# Patient Record
Sex: Male | Born: 1990 | Race: White | Hispanic: No | Marital: Single
Health system: Midwestern US, Community
[De-identification: ages and names within clinical notes are randomized; demographics above are authoritative.]

## PROBLEM LIST (undated history)

## (undated) DIAGNOSIS — S86011D Strain of right Achilles tendon, subsequent encounter: Secondary | ICD-10-CM

---

## 2019-03-14 ENCOUNTER — Ambulatory Visit: Admit: 2019-03-14 | Discharge: 2019-03-14 | Payer: PRIVATE HEALTH INSURANCE | Attending: Foot and Ankle Surgery

## 2019-03-14 DIAGNOSIS — S86011A Strain of right Achilles tendon, initial encounter: Secondary | ICD-10-CM

## 2019-03-14 NOTE — Telephone Encounter (Signed)
Procedure: Right Achilles tendon repair (10626)  ??  CPT codes: see above   ??  Diagnosis: Rupture of right Achilles tendon, initial encounter [S86.011A]  ??  Location for Surgery: [facility]  ??  Schedule for: 1 hour  ??  Admission Type: Outpatient  ??  Medical Clearance: No  ??  Antibiotic: Ancef 2g IV  ??  Anesthesia: General  ??  Position and Table type: Prone on Wilson Frame on Regular OR Table  ??  Radiology: None  ??  Implants: Arthrex PARS  ??  Equipment:    ??

## 2019-03-15 NOTE — Telephone Encounter (Signed)
[employer] phone call, no auth required ref# [id number]

## 2019-03-16 MED ORDER — INDOCYANINE GREEN 25 MG IV SOLR
25 MG | INTRAVENOUS | Status: AC
Start: 2019-03-16 — End: 2019-03-17

## 2019-03-16 MED FILL — INDOCYANINE GREEN 25 MG IV SOLR: 25 mg | INTRAVENOUS | Qty: 10

## 2019-03-17 ENCOUNTER — Inpatient Hospital Stay: Payer: PRIVATE HEALTH INSURANCE

## 2019-03-17 MED ORDER — LIDOCAINE HCL (PF) 2 % IJ SOLN
2 | INTRAMUSCULAR | Status: AC
Start: 2019-03-17 — End: 2019-03-17

## 2019-03-17 MED ORDER — OXYCODONE-ACETAMINOPHEN 5-325 MG PO TABS
5-325 MG | ORAL_TABLET | Freq: Four times a day (QID) | ORAL | 0 refills | Status: AC | PRN
Start: 2019-03-17 — End: 2019-03-24

## 2019-03-17 MED ORDER — ONDANSETRON HCL 4 MG/2ML IJ SOLN
4 | INTRAMUSCULAR | Status: AC
Start: 2019-03-17 — End: 2019-03-17

## 2019-03-17 MED ORDER — LIDOCAINE HCL (PF) 1 % IJ SOLN
1 % | Freq: Once | INTRAMUSCULAR | Status: DC | PRN
Start: 2019-03-17 — End: 2019-03-17

## 2019-03-17 MED ORDER — MIDAZOLAM HCL 2 MG/2ML IJ SOLN
2 | INTRAMUSCULAR | Status: AC
Start: 2019-03-17 — End: 2019-03-17

## 2019-03-17 MED ORDER — ONDANSETRON HCL 4 MG PO TABS
4 MG | ORAL_TABLET | Freq: Every day | ORAL | 0 refills | Status: AC | PRN
Start: 2019-03-17 — End: ?

## 2019-03-17 MED ORDER — LORAZEPAM 2 MG/ML IJ SOLN
2 MG/ML | INTRAMUSCULAR | Status: DC | PRN
Start: 2019-03-17 — End: 2019-03-17

## 2019-03-17 MED ORDER — ESMOLOL HCL 100 MG/10ML IV SOLN
100 | INTRAVENOUS | Status: AC
Start: 2019-03-17 — End: 2019-03-17

## 2019-03-17 MED ORDER — KETAMINE HCL 50 MG/5ML IJ SOSY
50 | INTRAMUSCULAR | Status: AC
Start: 2019-03-17 — End: 2019-03-17

## 2019-03-17 MED ORDER — LACTATED RINGERS IV SOLN
INTRAVENOUS | Status: DC
Start: 2019-03-17 — End: 2019-03-17
  Administered 2019-03-17: 14:00:00 via INTRAVENOUS

## 2019-03-17 MED ORDER — HYDRALAZINE HCL 20 MG/ML IJ SOLN
20 MG/ML | INTRAMUSCULAR | Status: DC | PRN
Start: 2019-03-17 — End: 2019-03-17

## 2019-03-17 MED ORDER — ONDANSETRON HCL 4 MG/2ML IJ SOLN
4 MG/2ML | Freq: Once | INTRAMUSCULAR | Status: DC | PRN
Start: 2019-03-17 — End: 2019-03-17

## 2019-03-17 MED ORDER — ROCURONIUM BROMIDE 50 MG/5ML IV SOLN
50 | INTRAVENOUS | Status: AC
Start: 2019-03-17 — End: 2019-03-17

## 2019-03-17 MED ORDER — DIPHENHYDRAMINE HCL 50 MG/ML IJ SOLN
50 MG/ML | Freq: Once | INTRAMUSCULAR | Status: DC | PRN
Start: 2019-03-17 — End: 2019-03-17

## 2019-03-17 MED ORDER — GABAPENTIN 300 MG PO CAPS
300 MG | Freq: Once | ORAL | Status: AC
Start: 2019-03-17 — End: 2019-03-17
  Administered 2019-03-17: 14:00:00 300 mg via ORAL

## 2019-03-17 MED ORDER — PROPOFOL 200 MG/20ML IV EMUL
200 | INTRAVENOUS | Status: AC
Start: 2019-03-17 — End: 2019-03-17

## 2019-03-17 MED ORDER — BUPIVACAINE-EPINEPHRINE-DEXAMETHASONE (TAP) SYRINGE
Status: AC
Start: 2019-03-17 — End: 2019-03-17

## 2019-03-17 MED ORDER — NORMAL SALINE FLUSH 0.9 % IV SOLN
0.9 % | INTRAVENOUS | Status: DC | PRN
Start: 2019-03-17 — End: 2019-03-17

## 2019-03-17 MED ORDER — PROMETHAZINE HCL 25 MG/ML IJ SOLN
25 MG/ML | Freq: Once | INTRAMUSCULAR | Status: DC | PRN
Start: 2019-03-17 — End: 2019-03-17

## 2019-03-17 MED ORDER — ROPIVACAINE ELASTOMERIC INFUSION 0.2%
Status: DC
Start: 2019-03-17 — End: 2019-03-17
  Administered 2019-03-17: 18:00:00 545 mL

## 2019-03-17 MED ORDER — MIDAZOLAM HCL 2 MG/2ML IJ SOLN
2 MG/ML | INTRAMUSCULAR | Status: DC | PRN
Start: 2019-03-17 — End: 2019-03-17
  Administered 2019-03-17: 14:00:00 2 mg via INTRAVENOUS

## 2019-03-17 MED ORDER — FAMOTIDINE 20 MG PO TABS
20 MG | Freq: Once | ORAL | Status: AC
Start: 2019-03-17 — End: 2019-03-17
  Administered 2019-03-17: 14:00:00 20 mg via ORAL

## 2019-03-17 MED ORDER — ALPRAZOLAM 0.25 MG PO TBDP
0.25 MG | ORAL | Status: DC | PRN
Start: 2019-03-17 — End: 2019-03-17

## 2019-03-17 MED ORDER — NORMAL SALINE FLUSH 0.9 % IV SOLN
0.9 % | Freq: Two times a day (BID) | INTRAVENOUS | Status: DC
Start: 2019-03-17 — End: 2019-03-17

## 2019-03-17 MED ORDER — LABETALOL HCL 5 MG/ML IV SOLN
5 MG/ML | INTRAVENOUS | Status: DC | PRN
Start: 2019-03-17 — End: 2019-03-17

## 2019-03-17 MED ORDER — ASPIRIN EC 81 MG PO TBEC
81 MG | ORAL_TABLET | Freq: Every day | ORAL | 0 refills | Status: AC
Start: 2019-03-17 — End: ?

## 2019-03-17 MED ORDER — ACETAMINOPHEN 500 MG PO TABS
500 MG | Freq: Once | ORAL | Status: AC
Start: 2019-03-17 — End: 2019-03-17
  Administered 2019-03-17: 14:00:00 1000 mg via ORAL

## 2019-03-17 MED ORDER — DEXAMETHASONE SODIUM PHOSPHATE 10 MG/ML IJ SOLN
10 | INTRAMUSCULAR | Status: AC
Start: 2019-03-17 — End: 2019-03-17

## 2019-03-17 MED ORDER — CEFAZOLIN SODIUM-DEXTROSE 2-4 GM/100ML-% IV SOLN
2-4 GM/100ML-% | INTRAVENOUS | Status: AC
Start: 2019-03-17 — End: 2019-03-17
  Administered 2019-03-17: 15:00:00 2000 mg via INTRAVENOUS

## 2019-03-17 MED FILL — ROPIVACAINE ELASTOMERIC INFUSION 0.2%: Qty: 550

## 2019-03-17 MED FILL — CEFAZOLIN SODIUM-DEXTROSE 2-4 GM/100ML-% IV SOLN: 2-4 GM/100ML-% | INTRAVENOUS | Qty: 100

## 2019-03-17 MED FILL — LIDOCAINE HCL (PF) 2 % IJ SOLN: 2 % | INTRAMUSCULAR | Qty: 5

## 2019-03-17 MED FILL — DIPRIVAN 200 MG/20ML IV EMUL: 200 MG/20ML | INTRAVENOUS | Qty: 20

## 2019-03-17 MED FILL — KETAMINE HCL 50 MG/5ML IJ SOSY: 50 MG/5ML | INTRAMUSCULAR | Qty: 5

## 2019-03-17 MED FILL — GABAPENTIN 300 MG PO CAPS: 300 mg | ORAL | Qty: 1

## 2019-03-17 MED FILL — BUPIVACAINE-EPINEPHRINE-DEXAMETHASONE (TAP) SYRINGE: Qty: 90

## 2019-03-17 MED FILL — MIDAZOLAM HCL 2 MG/2ML IJ SOLN: 2 mg/mL | INTRAMUSCULAR | Qty: 2

## 2019-03-17 MED FILL — ROCURONIUM BROMIDE 50 MG/5ML IV SOLN: 50 MG/5ML | INTRAVENOUS | Qty: 5

## 2019-03-17 MED FILL — ONDANSETRON HCL 4 MG/2ML IJ SOLN: 4 MG/2ML | INTRAMUSCULAR | Qty: 2

## 2019-03-17 MED FILL — FAMOTIDINE 20 MG PO TABS: 20 mg | ORAL | Qty: 1

## 2019-03-17 MED FILL — DEXAMETHASONE SODIUM PHOSPHATE 10 MG/ML IJ SOLN: 10 mg/mL | INTRAMUSCULAR | Qty: 1

## 2019-03-17 MED FILL — ESMOLOL HCL 100 MG/10ML IV SOLN: 100 MG/10ML | INTRAVENOUS | Qty: 10

## 2019-03-17 MED FILL — MAPAP 500 MG PO TABS: 500 mg | ORAL | Qty: 2

## 2019-03-17 NOTE — Discharge Instructions (Signed)
SPLINT INSTRUCTIONS  Keep your splint clean and dry.  Do not put anything down in the splint to scratch.  If your splint gets wet or starts to feel uncomfortable call Dr Karolee Meloni immediately ([phone]).    If you see any blood on the outside bandage reinforce it on the surface with gauze and an ace bandage and call Dr Larrie Fraizer [phone].    Do not walk or stand on your splint.  You can rest the splint on the floor but do not put weight on it.  You should get up and move around once or twice an hour based on your comfort level.  You are not to be at bed rest meaning you need to get up and move around in order to prevent blood clots.    It is OK to wiggle your toes and you can contract your calf muscle inside the splint as well.    When you put your foot down it is normal for the toes to turn a bluish or purple color.  Once you elevate the foot for a few minutes a normal pink color should return underneath the toenails.  If the color does not change within 10 minutes after elevating call Dr. Jibril Mcminn.    ELEVATION  Keep your foot/ankle elevated for comfort.  Elevating your foot/ankle is the best way to control your swelling and pain.  Elevation means keeping the toes at the level of your nose.  If your leg is not elevated to that height then it is not truly elevated.    Correct elevation height         Incorrect elevation height    MEDICATIONS  You have been given a prescription for a narcotic medication that is intended for postoperative pain control.  This medication should be used judiciously to try and minimize your discomfort after surgery.  The medication will not relieve all of your pain and you should not take large amounts of the medication in an attempt to be pain free.  Please understand that narcotic medications can be addictive and they are intended to decrease but not eliminate your pain and should therefore be used in moderation.  If you have questions regarding taking the narcotic medication and other  medications that you are currently on please call the office.  You cannot drive or operate machinery while taking the narcotic medication.  You should not drink alcohol or use recreational drugs while taking the narcotic medication.The state of [place] restricts the amount of pain pills that can be legally prescribed and no more than one prescription in a week can be given.  If you require a refill of the pain medication it requires that someone come to the office in person as it cannot be called in to the pharmacy or E-prescribed.  Please understand that there is a limit to the total amount of pain medication that can be prescribed so make every effort to take it only when needed.  Unless otherwise instructed by Dr Lorene Klimas you can take an over the counter anti-inflammatory to help with your pain as well.  This includes but is not limited to Ibuprofen, Advil, Motrin, Alleve, etc.    WEIGHTBEARING INSTRUCTIONS  Dr Jachai Okazaki wants you to be  nonweight bearing on the Right lower extremity. You will continue this weight bearing restriction until your 2 week followup and then your ability to put weight on the operative extremity will be updated.  If you have any problems maintaining this weight bearing status   please call the office so that appropriate instructions can be given.     FREQUENTLY ASKED QUESTIONS  FREQUENTLY ASKED QUESTIONS  If I am supposed to be non-weight bearing on the operative foot/ankle can I still rest the foot on the ground when I am sitting?   Yes, it is OK to rest your operative foot on the ground when you are sitting.     Is it normal to feel a rush of fluid or pressure in my foot/ankle when I put it down?   Yes, after most foot, ankle or leg surgeries it is very common to feel immediate swelling or pressure in your foot, ankle and leg.    Is it OK to put ice on my foot/ankle to help with the swelling and pain?   After foot/ankle surgery elevating the foot/ankle is much better at relieving pain and  swelling than ice.  In many cases you bandage prevents the cold from getting to your   skin.  At your 2 week visit when your bandage is removed, icing the foot/ankle works much better and you can start it then.    If I received a nerve block before surgery, how long will it last?   A single shot nerve block can last anywhere from 8 hours to 36 hours on average, some patients' single shot blocks stop sooner and some can go a little longer.    A catheter block (pain ball) lasts longer when you have it dialed down and it runs out faster if you have it dialed up.  For most patients it lasts for a day and a half to 3 days.    Can I shower or bathe after surgery?   Yes, you can shower or bathe after surgery but you have to put a plastic bag over your splint/dressing to keep the splint/bandage absolutely dry.  The splint/bandage is   like a sponge and any water that gets in can damage your skin or cause your incision/incisions to open up and get infected.  If your splint/bandage gets water in it, call Dr.   Tawana Pasch's office ([phone]) immediately and you will be instructed which office can see your the quickest to change your splint/bandage.

## 2019-03-28 ENCOUNTER — Ambulatory Visit: Admit: 2019-03-28 | Discharge: 2019-03-28 | Payer: PRIVATE HEALTH INSURANCE | Attending: Foot and Ankle Surgery

## 2019-03-28 DIAGNOSIS — S86011D Strain of right Achilles tendon, subsequent encounter: Secondary | ICD-10-CM

## 2019-03-28 MED ORDER — TRAMADOL HCL 50 MG PO TABS
50 MG | ORAL_TABLET | Freq: Four times a day (QID) | ORAL | 0 refills | Status: AC | PRN
Start: 2019-03-28 — End: 2019-04-04

## 2019-03-28 NOTE — Progress Notes (Signed)
[facility]  [address] [place] [address]  Dept: [phone]  Loc: [phone]          [place] KROTZ  [birth year]  [id number]  03/28/2019    HPI  Craig Patrick is here for his 2 week(s) postoperative visit s/p  Right Achilles tendon percutaneous repair.  His surgery was on 03/17/19.     Craig Patrick reports that his pain has decreased since the surgery/last visit    Craig Patrick reports that his swelling  has decreased since the last visit    Craig Patrick had been instructed to be nonweight bearing on the Right lower extremity.   Craig Patrick has been compliant with his weight bearing restrictions    Craig Patrick has not started therapy yet    Craig Patrick denies fevers and chills and has not had calf pain or shortness of breath    In general Craig Patrick feels that he  has been doing well since the surgery    Other issues or concerns that Craig Patrick would like addressed at this visit: he never got his boot back after surgery.     Review of Systems    No Known Allergies  Current Outpatient Medications   Medication Sig Dispense Refill   ??? traMADol (ULTRAM) 50 MG tablet Take 1 tablet by mouth every 6 hours as needed for Pain for up to 7 days. Intended supply: 7 days. Take lowest dose possible to manage pain 28 tablet 0   ??? emtricitabine-tenofovir (TRUVADA) 200-300 MG per tablet Take 1 tablet by mouth daily     ??? ondansetron (ZOFRAN) 4 MG tablet Take 1 tablet by mouth daily as needed for Nausea or Vomiting 30 tablet 0   ??? aspirin EC 81 MG EC tablet Take 1 tablet by mouth daily 30 tablet 0     No current facility-administered medications for this visit.      History reviewed. No pertinent past medical history.  Past Surgical History:   Procedure Laterality Date   ??? OTHER SURGICAL HISTORY Right     TFCC repair   ??? TONSILLECTOMY AND ADENOIDECTOMY     ??? WISDOM TOOTH EXTRACTION       Social History     Socioeconomic History   ??? Marital status: Single      Spouse name: Not on file   ??? Number of children: Not on file   ??? Years of education: Not on file   ??? Highest education level: Not on file   Occupational History   ??? Not on file   Social Needs   ??? Financial resource strain: Not on file   ??? Food insecurity     Worry: Not on file     Inability: Not on file   ??? Transportation needs     Medical: Not on file     Non-medical: Not on file   Tobacco Use   ??? Smoking status: Never Smoker   ??? Smokeless tobacco: Never Used   Substance and Sexual Activity   ??? Alcohol use: Not on file   ??? Drug use: Not on file   ??? Sexual activity: Not on file   Lifestyle   ??? Physical activity     Days per week: Not on file     Minutes per session: Not on file   ??? Stress: Not on file   Relationships   ??? Social Product manager  on phone: Not on file     Gets together: Not on file     Attends religious service: Not on file     Active member of club or organization: Not on file     Attends meetings of clubs or organizations: Not on file     Relationship status: Not on file   ??? Intimate partner violence     Fear of current or ex partner: Not on file     Emotionally abused: Not on file     Physically abused: Not on file     Forced sexual activity: Not on file   Other Topics Concern   ??? Not on file   Social History Narrative   ??? Not on file     History reviewed. No pertinent family history.  Vitals:    03/28/19 0846   Temp: 96.4 ??F (35.8 ??C)       Physical Exam   This is an age appropriate appearing male who is alert and oriented x 3.  The patient appears well nourished.  The patient is able to verbalize normally and seems to have a good understanding of his situation.    Normocephalic and atraumatic    Respiratory:  No shortness of breath    The Right lower extremity is examined.    Skin:   ruborous in a dependent position    Examination of the Right achilles reveals,  Posterior distal incision is healed and benign    Swelling at the incision site is moderate    Sensation intact to light touch in  distribution of sural nerve: Yes  Sensation intact to light touch in distribution of the tibial nerve: Yes  Able to actively plantarflex and dorsiflex the ankle: Yes  Calf is tender to palpation: No  Brisk capillary refill is present in the foot/toes: Yes    Previous Notes/Records Reviewed:  No outside notes reviewed for this encounter.    Radiographic finding:     No radiographic images were obtained or reviewed today    Lab Result Review:  No labs were reviewed/no labs were available for review this visit.    IMPRESSION:      ICD-10-CM    1. Rupture of right Achilles tendon, subsequent encounter  S86.011D Walking boot     traMADol (ULTRAM) 50 MG tablet       MEDICAL DECISION MAKING:    I had a discussion with Craig Patrick to make sure he had a good understanding of what I think the main issues and diagnoses are that are affecting him today, and reviewed the plan going forward.    Craig Patrick was placed in an aircast boot with a heel wedge today and given instructions on how to put it on and take it off and how to care for the boot.  We explained that each time the boot is taken off the air needs to be removed from the boot so that when Craig Patrick puts it on the next time, his foot/ankle will fit properly.  We explained that the foot and ankle should fit securely in the boot only on correctly and filled with enough air to make it snug but not tight.  If Craig Patrick has pain caused by the boot or if he has any skin irritation from the boot, he was instructed to remove the boot and call the office for instructions.  Craig Patrick was instructed to remove one section of the wedge each week for the next 4 weeks.  This will take  her his ankle from a plantar flexed position to a neutral position over the next 4 weeks.  Craig Patrick can take the boot off for sleep, for bathing and to work on ROM of hisankle, foot and toes several times a day.    Craig Patrick was given a prescription for   Orders Placed This Encounter   Medications   ??? traMADol (ULTRAM)  50 MG tablet     Sig: Take 1 tablet by mouth every 6 hours as needed for Pain for up to 7 days. Intended supply: 7 days. Take lowest dose possible to manage pain     Dispense:  28 tablet     Refill:  0     Reduce doses taken as pain becomes manageable       I discussed the fact that although Ultram is not a narcotic but it does carry the risk of addiction if taken over a long period of time.  In addition Ultram can lower the seizure threshold and if Dahmir has any history of seizures or seizure like activity which he did not share with me today, he should not use the Ultram.  While taking Ultram Laird was instructed to not drive or operate or be in the vicinity of heavy machinery.      Return in about 4 weeks (around 04/25/2019).    Voice recognition was used for portions of this note and although it was reviewed prior to signing some incorrect words or phrases could be present.    Electronically signedby Moreen Fowler, MD on 03/28/19 at 8:51 AM EST

## 2019-04-08 ENCOUNTER — Encounter

## 2019-04-25 ENCOUNTER — Ambulatory Visit: Admit: 2019-04-25 | Discharge: 2019-04-25 | Payer: PRIVATE HEALTH INSURANCE | Attending: Foot and Ankle Surgery

## 2019-04-25 DIAGNOSIS — S86011D Strain of right Achilles tendon, subsequent encounter: Secondary | ICD-10-CM

## 2019-05-16 NOTE — Telephone Encounter (Signed)
Faxed order in chart. Thank you!      Your fax has been successfully sent to PT orders for [facility] at [phone].  ------------------------------------------------------------    ------------------------------------------------------------  05/16/2019 1:21:23 PM Transmission Record   Sent to [id number] with remote ID "[id number]"   Result: (0/339;0/0) Success   Page record: 1 - 2   Elapsed time: 00:53 on channel 39

## 2019-05-16 NOTE — Telephone Encounter (Signed)
Daisy from [facility] contacted the office wanting to know if his PT orders can be faxed over to their office. Pt. Has a string of right achilles. She said that the original order for PT would be ok to send. Their fax number is [phone]. Their phone number is [phone]

## 2019-06-06 ENCOUNTER — Ambulatory Visit: Admit: 2019-06-06 | Discharge: 2019-06-06 | Payer: PRIVATE HEALTH INSURANCE | Attending: Foot and Ankle Surgery

## 2019-06-06 DIAGNOSIS — S86011D Strain of right Achilles tendon, subsequent encounter: Secondary | ICD-10-CM

## 2019-06-06 MED ORDER — KETOPROFEN POWD
0 refills | Status: AC
Start: 2019-06-06 — End: ?

## 2019-06-06 NOTE — Progress Notes (Signed)
[name]  [facility]  [address]  [address]  [place] [address]  Dept: [phone]  Loc: [phone]          Craig Patrick  1990/10/10  [id number]  06/06/2019    HPI  Craig Patrick is here for his 12 week(s) postoperative visit s/p Right  Achilles tendon percutaneous repair.  His surgery was on 03/17/19.     Craig Patrick reports that his pain has decreased since the surgery/last visit    Craig Patrick reports that his swelling  has been minimal    Craig Patrick had been instructed to be weight bearing as tolerated on the Right lower extremity.   Craig Patrick has been compliant with his weight bearing restrictions    Craig Patrick has been working with outpatient physical therapy and it is going well    Craig Patrick denies fevers and chills and has not had calf pain or shortness of breath    In general Craig Patrick feels that he  has been doing well since the surgery    Other issues or concerns that Craig Patrick would like addressed at this visit:  he has no other issues or concerns to discuss      Review of Systems    No Known Allergies  Current Outpatient Medications   Medication Sig Dispense Refill   ??? Ketoprofen POWD Ketoprofen 16%, Cyclobenzaprine 2%, Gabapentin 6%, Guanethidine 6%, bupivacaine 2% in Lipoderm or PLO gel    Apply a pea size amount to affected area three times a day 1 Bottle 0   ??? emtricitabine-tenofovir (TRUVADA) 200-300 MG per tablet Take 1 tablet by mouth daily     ??? ondansetron (ZOFRAN) 4 MG tablet Take 1 tablet by mouth daily as needed for Nausea or Vomiting 30 tablet 0   ??? aspirin EC 81 MG EC tablet Take 1 tablet by mouth daily 30 tablet 0     No current facility-administered medications for this visit.      No past medical history on file.  Past Surgical History:   Procedure Laterality Date   ??? OTHER SURGICAL HISTORY Right     TFCC repair   ??? TONSILLECTOMY AND ADENOIDECTOMY     ??? WISDOM TOOTH EXTRACTION       Social History     Socioeconomic  History   ??? Marital status: Single     Spouse name: Not on file   ??? Number of children: Not on file   ??? Years of education: Not on file   ??? Highest education level: Not on file   Occupational History   ??? Not on file   Social Needs   ??? Financial resource strain: Not on file   ??? Food insecurity     Worry: Not on file     Inability: Not on file   ??? Transportation needs     Medical: Not on file     Non-medical: Not on file   Tobacco Use   ??? Smoking status: Never Smoker   ??? Smokeless tobacco: Never Used   Substance and Sexual Activity   ??? Alcohol use: Not on file   ??? Drug use: Not on file   ??? Sexual activity: Not on file   Lifestyle   ??? Physical activity     Days per week: Not on file     Minutes per session: Not on file   ??? Stress: Not on file   Relationships   ???  Social Product manager on phone: Not on file     Gets together: Not on file     Attends religious service: Not on file     Active member of club or organization: Not on file     Attends meetings of clubs or organizations: Not on file     Relationship status: Not on file   ??? Intimate partner violence     Fear of current or ex partner: Not on file     Emotionally abused: Not on file     Physically abused: Not on file     Forced sexual activity: Not on file   Other Topics Concern   ??? Not on file   Social History Narrative   ??? Not on file     No family history on file.  Vitals:    06/06/19 0850   BP: (!) 146/81   Pulse: 70   Temp: 97.7 ??F (36.5 ??C)       Physical Exam   Examination of the Right achilles reveals,    Posterior distal incision is healed and benign.  The Achilles tendon is intact to palpation throughout its course      Swelling at the incision sites is mild    Sensation intact to light touch in distribution of sural nerve: Yes  Sensation intact to light touch in distribution of the tibial nerve: Yes  Able to actively plantarflex and dorsiflex the ankle: Yes, gastrocsoleus strength 4/5  Calf is tender to palpation: No  Brisk capillary refill is  present in the foot/toes: Yes    Previous Notes/Records Reviewed:  No outside notes reviewed for this encounter.    Radiographic finding:     No radiographic images were obtained or reviewed today    Lab Result Review:  No labs were reviewed/no labs were available for review this visit.    IMPRESSION:      ICD-10-CM    1. Rupture of right Achilles tendon, subsequent encounter  S86.011D Ketoprofen POWD       MEDICAL DECISION MAKING:    I had a discussion with Medhansh to make sure he had a good understanding of what I think the main issues and diagnoses are that are affecting him today, and reviewed the plan going forward.    I explained to Nithin and his mother that his Achilles tendon is intact and healed on exam.  He can continue to wean from his boot and I encouraged him to walk in a shoe as much as possible.  He understands it will take a full year for him to regain his strength.  He can gradually increase his activity level as he can tolerate based on his symptoms.  We talked about some of the soreness that he feels when he is walking without the boot and trying a medicated compound cream to help.    Kingsly was given a prescription for   Orders Placed This Encounter   Medications   ??? Ketoprofen POWD     Sig: Ketoprofen 16%, Cyclobenzaprine 2%, Gabapentin 6%, Guanethidine 6%, bupivacaine 2% in Lipoderm or PLO gel    Apply a pea size amount to affected area three times a day     Dispense:  1 Bottle     Refill:  0       I explained to Quintavious that the medicated compound cream may relieve some of his symptoms.  I explained that if he has any reactions to the compound  medication he needs to wash the compound off and call the office immediately.      Return if symptoms worsen or fail to improve.    Voice recognition was used for portions of this note and although it was reviewed prior to signing some incorrect words or phrases could be present.    Electronically signedby Moreen Fowler, MD on 06/06/19 at 8:51 AM  EDT

## 2020-02-06 ENCOUNTER — Ambulatory Visit: Admit: 2020-02-06 | Discharge: 2020-02-06 | Payer: PRIVATE HEALTH INSURANCE | Attending: Foot and Ankle Surgery

## 2020-02-06 DIAGNOSIS — S86011D Strain of right Achilles tendon, subsequent encounter: Secondary | ICD-10-CM

## 2020-02-06 NOTE — Progress Notes (Signed)
[facility]  [facility] [place]  [address]  [address]  [place] [address]  Dept: [phone]  Loc: [phone]    Craig Patrick  04/21/1990  [id number]  02/06/2020    HISTORY OF PRESENT ILLNESS:    Craig Patrick is a 29 y.o. male here today for evaluation of his Right ankle    Craig Patrick states the problem has been present for 2 week(s)    Craig Patrick states the problem started after he was walking on sand and felt a pop in this ankle. He notice increased weakness for a couple of days after he felt the pop.  He had an Achilles tendon repair in January of this year.  He has felt weak since the injury.  He was able to push off better prior to the injury.  He has pain in his Achilles.  He had fully completed physical therapy prior to the injury.    Craig Patrick has tried or has been treated with the following: NSAID's and bracing (walking boot for a couple of day after he returned home.)        Review of Systems   Constitutional: Negative for chills and fever.   Respiratory: Negative for cough and shortness of breath.    Cardiovascular: Negative for leg swelling.   Gastrointestinal: Negative for nausea and vomiting.   Musculoskeletal: Positive for gait problem and joint swelling. Negative for arthralgias and back pain.        PAST MEDICAL HISTORY:  History reviewed. No pertinent past medical history.    No Known Allergies     PHYSICAL EXAM:    Temp 97.7 ??F (36.5 ??C)    Ht 5\' 11"  (1.803 m)    Wt 185 lb (83.9 kg)    BMI 25.80 kg/m??   This is an age appropriate appearing male who is alert and oriented x 3.  The patient appears well nourished.  Psychiatric: The patient is able to verbalize normally and seems to have a good understanding of his situation.    Right lower extremity examination    Lymphatic System:  Posterior ankle and Achilles swelling    Vascular:  Dorsalis pedis pulse: 2+  Posterior tibial pulse:  2+  Capillary refill is less than 3 seconds      Skin/nails:  Normal appearance, warm and dry, healed surgical incision overlying the Achilles  Hair growth present on foot and toes    Neurologic:  Sensation intact to light touch throughout the foot and the ankle    Muscle:  Muscle strength testing:    Anterior tibialis: 5/5    Posterior tibialis: 5/5    Peroneus brevis:  5/5    Peroneus longus:  5/5    Gastrocsoleus:  5/5     Tender palpation overlying the Achilles tendon in the mid substance.  I cannot identify a defect in the tendon.  Thompson's test is negative with normal plantarflexion of the foot and ankle with a proximal gastroc squeeze    Gait and Station:  Craig Patrick is able to ambulate with a limp  Craig Patrick is able to stand unassisted and maintains balance          RADIOGRAPHIC INTERPRETATION:    3 weight bearing views of the Right ankle were obtained and the following is my interpretation of the findings present of the X-rays:The ankle mortise and syndesmosis are anatomically aligned.  No fractures or dislocations are noted.  No calcifications within the Achilles tendon  are seen.    REVIEW OF RELATED PREVIOUS DOCUMENTATION:    No documents related to the current problem(s) were reviewed or no documents were available for review.    LABORATORY RESULT INTERPRETATION:    No labs were reviewed/No labs available for review    DIAGNOSIS:      ICD-10-CM    1. Rupture of right Achilles tendon, subsequent encounter  S86.011D    2. Acute right ankle pain  M25.571         MEDICAL DECISION MAKING:    I had a discussion with Craig Patrick to make sure he has a good understanding of the diagnoses/issues that I think are present today and understands the plan moving forward.    I explained to Craig Patrick that based on his exam and my concern for a rupture of the Achilles tendon an MRI of the Right ankle is necessary.  Craig Patrick will follow-up with me after the MRI to discuss the findings and to go over the subsequent plan.    He can continue to weight-bear as tolerated based on his  comfort level.  If he needs to wear his postsurgical boot for weightbearing activities then that is reasonable.        Return for review of ordered tests/labs.    Electronically signed by Moreen Fowler, MD  [facility]  Department of Orthopedic surgery  02/06/2020  2:41 PM    Voice recognition was used for portions of this note and although it was reviewed prior to signing some incorrect words or phrases could be present.

## 2020-03-29 IMAGING — MR ANKLE^ROUTINE ANKLE
7 of 9 series · 38 of 40 positions shown · non-contrast
Comparison: none

﻿

Pertinent Hx:  Injury in January 2020.  Felt a pop.  History of previous Achilles surgery.
TECHNIQUE: Images were taken in axial, coronal and sagittal planes.  T1 and T2-weighted imaging was performed.

[Series 3: PD · axial · 4.0mm · 0.62mm/px · z∈[-40,+145]mm · 6 of 38 slices shown]
[im 1/38]
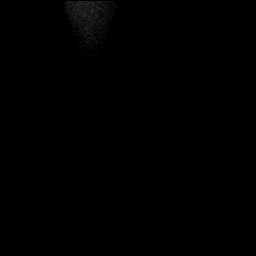
[im 8/38]
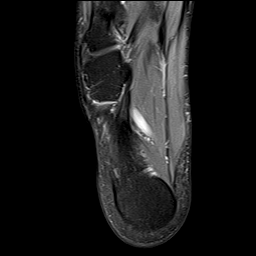
[im 15/38]
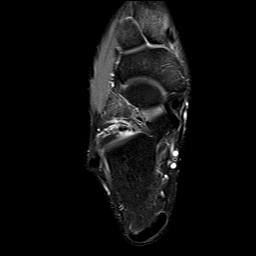
[im 23/38]
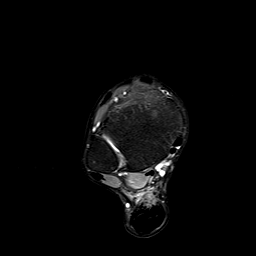
[im 30/38]
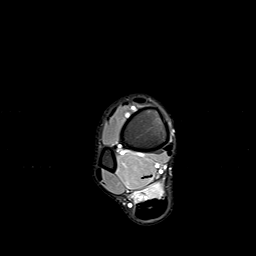
[im 38/38]
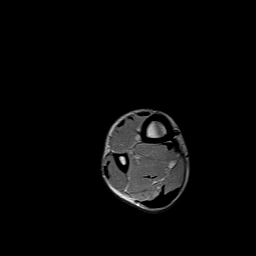

[Series 4: T2 · axial · 4.0mm · 0.50mm/px · z∈[-40,+145]mm · 7 of 38 slices shown (1 of 2)]
[im 1/38]
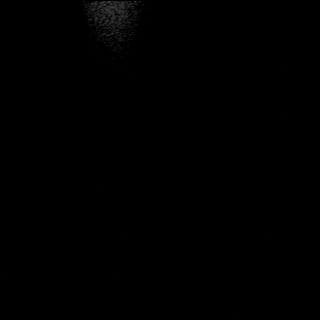
[im 7/38]
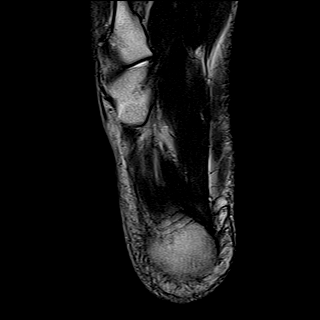
[im 13/38]
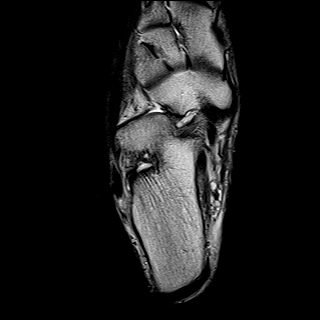
[im 19/38]
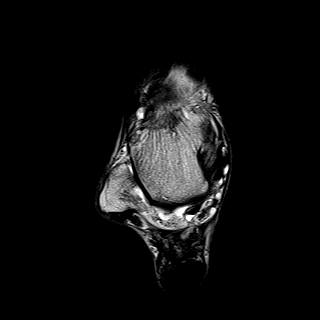
[im 25/38]
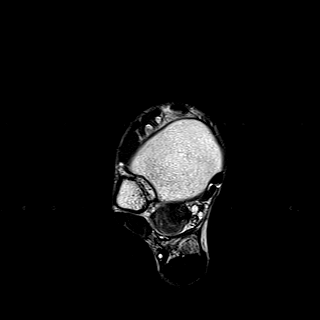
[im 31/38]
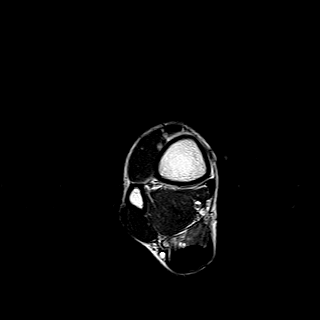
[im 38/38]
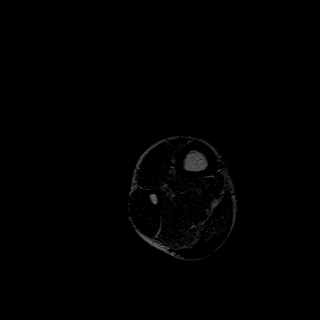

[Series 5: T1 · axial · 4.0mm · 0.62mm/px · z∈[-40,+145]mm · 7 of 38 slices shown (1 of 2)]
[im 1/38]
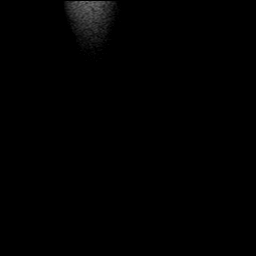
[im 7/38]
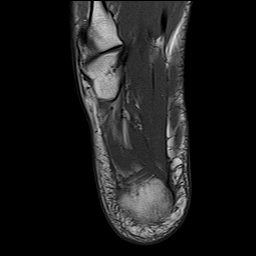
[im 13/38]
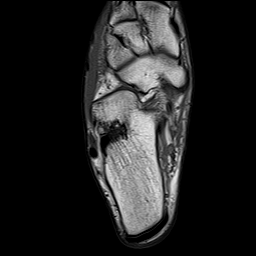
[im 19/38]
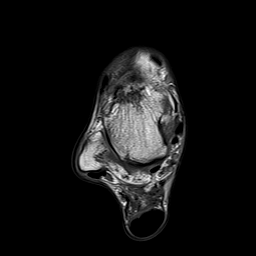
[im 25/38]
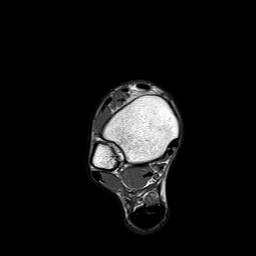
[im 31/38]
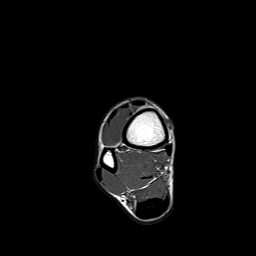
[im 38/38]
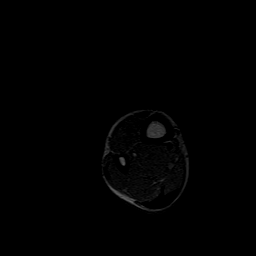

[Series 6: PD fat-sat · sagittal · 3.0mm · 0.53mm/px · 4 of 23 slices shown (1 of 2)]
[im 1/23]
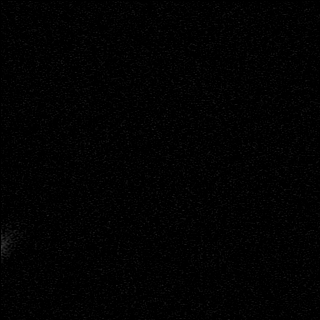
[im 8/23]
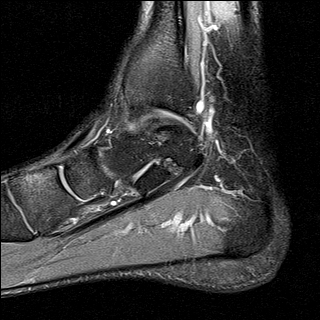
[im 15/23]
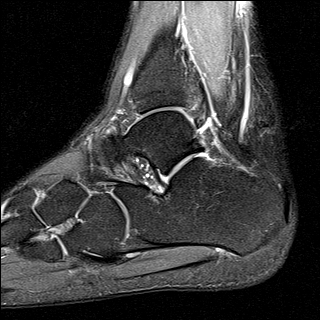
[im 23/23]
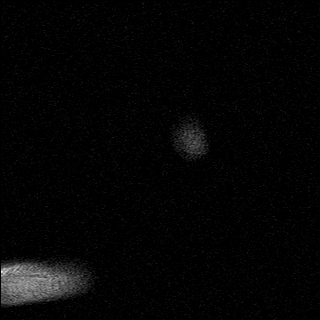

[Series 7: T2 · sagittal · 3.0mm · 0.53mm/px · 4 of 23 slices shown (2 of 2)]
[im 1/23]
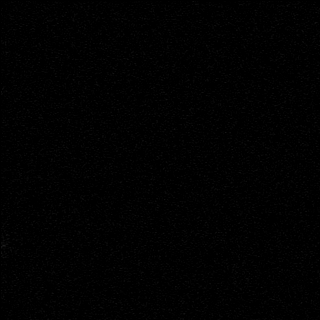
[im 8/23]
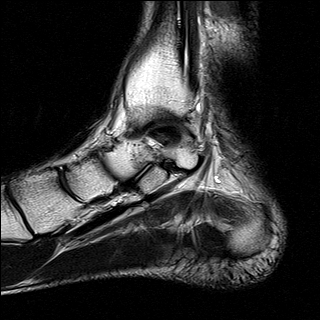
[im 15/23]
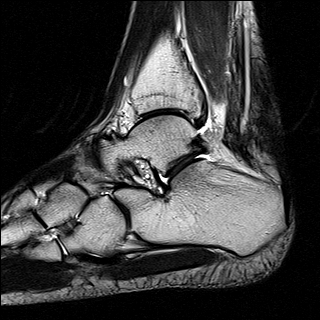
[im 23/23]
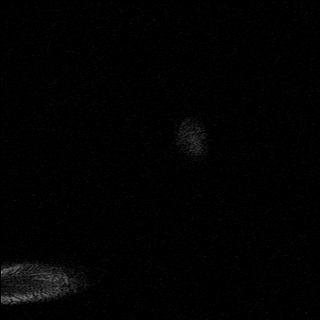

[Series 8: PD fat-sat · coronal · 4.0mm · 0.53mm/px · 5 of 28 slices shown (2 of 2)]
[im 1/28]
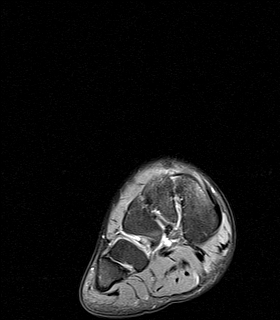
[im 7/28]
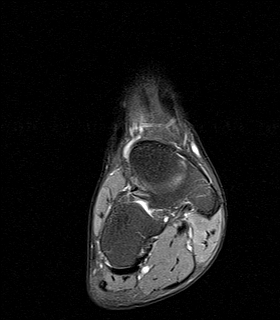
[im 14/28]
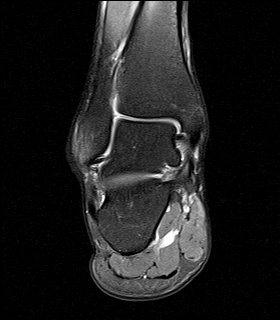
[im 21/28]
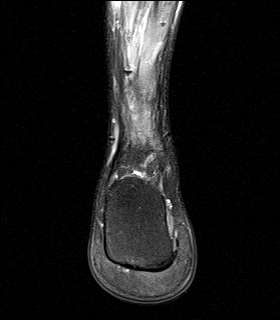
[im 28/28]
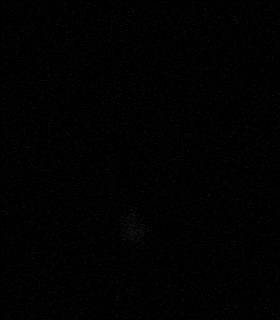

[Series 9: T1 · coronal · 4.0mm · 0.33mm/px · 5 of 28 slices shown (2 of 2)]
[im 1/28]
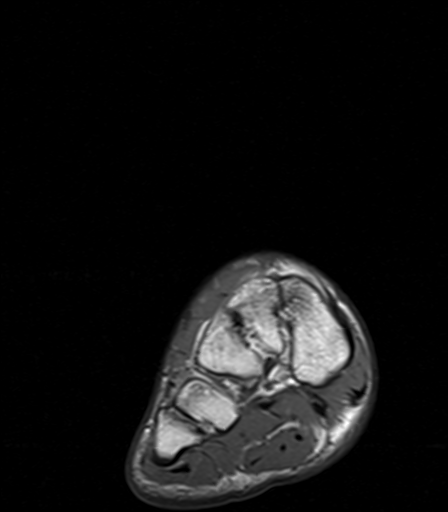
[im 7/28]
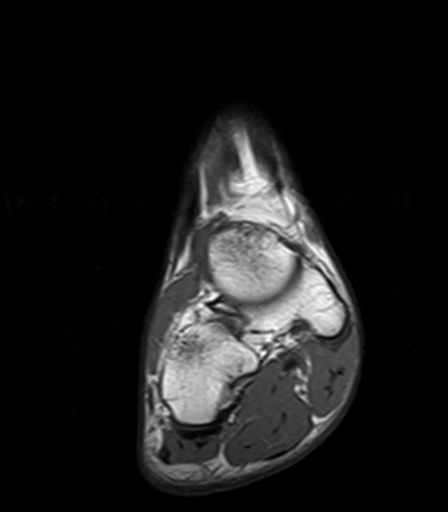
[im 14/28]
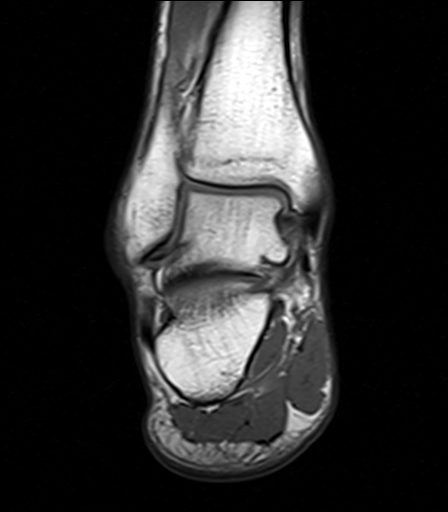
[im 21/28]
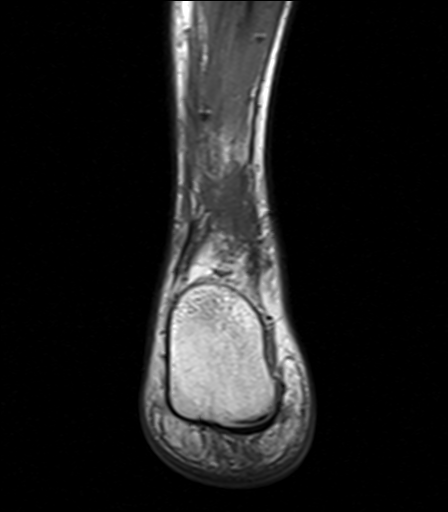
[im 28/28]
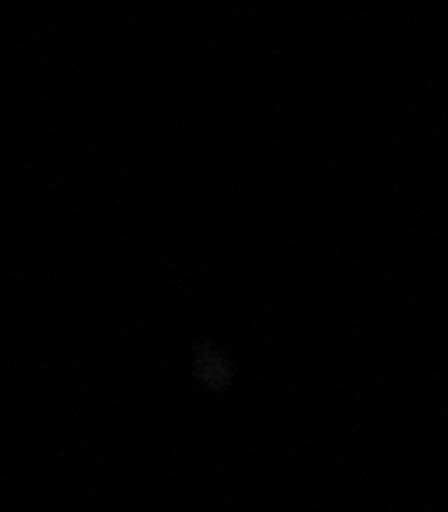

[38 of 40 positions shown; findings below may reference images not displayed]

FINDINGS: The Achilles tendon is very thick and this is likely a postsurgical change.  The attachment is intact on the calcaneus and no tear can be identified.  There are small areas of abnormal signal within the thickened Achilles tendon with likely evidence of chronic tendinopathy.  

There is no fluid in the retrocalcaneal bursa.

The plantar fascia appears normal.  

Bones of the hindfoot appear normal. 

No ankle abnormality can be identified.  No osteochondral abnormality.  No joint effusion.  Ankle ligaments are normal. 

Other tendons appear normal.
IMPRESSION: 1. Thickened Achilles tendon which is likely evidence of previous injury and previous surgery.  Small areas of chronic tendinopathy.  No tear of the tendon identified. 

2. No other abnormality identified.

## 2020-04-03 NOTE — Telephone Encounter (Signed)
In order to comply with, No Surprise Act, [facility] is providing you with the following attachments. Any Good Faith Estimate that may be provided are based on the services you are scheduled to receive. During your visit, there may be additional services required in order for the provider to complete your plan of care.        Patient Declined

## 2020-04-18 ENCOUNTER — Ambulatory Visit: Admit: 2020-04-18 | Discharge: 2020-04-18 | Payer: PRIVATE HEALTH INSURANCE | Attending: Foot and Ankle Surgery

## 2020-04-18 DIAGNOSIS — S86011D Strain of right Achilles tendon, subsequent encounter: Secondary | ICD-10-CM

## 2020-04-18 NOTE — Progress Notes (Signed)
CD UPLOADED, IMAGES IN AGFA

## 2020-04-18 NOTE — Progress Notes (Signed)
[facility]  [facility] [place]   [address]  [address]  [place] [address]  Dept: [phone]  Loc: [phone]    Craig Patrick  [birth year]  [id number]  04/18/2020    HISTORY OF PRESENT ILLNESS:    Craig Patrick is a 30 y.o. male here today for evaluation of his Right ankle pain, patient is here today in office to go over her MRI on his RT ankle. [place] got [place] done in [place]. We do have images & report    Impression :    1. Thickened achilles tendon which is likely evidence of previous injury and previous surgery. Small areas of chronic tendinoplasty. No tear of the tendon.  2. No other abnormality identified.     Craig Patrick states the problem has been present for 3 month(s)    Craig Patrick states the problem started as the result of an injury    Craig Patrick has tried or has been treated with the following: modifying his activity level and avoiding those activities which aggravate the problem, NSAID's, OTC shoe inserts, bracing and physical therapy.      Review of Systems   Surgical Risk Factors:              Allergies to Metals or Latex: NO              Have you been treated for a blood clot:  NO              Have you had a history of bleeding disorder:  NO              Have you had a history of Anesthetic problems: NO              Do you have tendency to bruise easily:  NO              Do you experience prolonged or excessive bleeding from cuts or after surgery: NO  General/Constitutional:                General: no              Cancer:  NO              Acute/Chronic Infections: NO  HEENT/Neck:              Problems with theThroat:  NO              Problems with the Eyes:  NO              Problems with the Ears:  NO              Problems with the Nose and Sinuses:  NO  Endocrine:              Problems with Diabetes: NO              Problems with Thyroid Disorder: NO  Thorax:                Problems with the Heart: NO              Problems with the  Lung:  no   Cardiovascular:              Problems with Circulation:  NO              Problems with High Blood pressure:  NO  Gastrointestinal:  Problems with Ulcers:  NO              Problems with the Liver:  no              Problems with Bowel Habits:  NO  Genitourinary:              Problems with the Genitals: NO              Urinary problems: NO              Kidney disease or stones:  NO  Skin:               Any general problems:  NO  Neurologic:              Dizziness, blurred vision, headaches, problems with balance :  NO              Seizures or Stroke: NO  Psychiatric:              Emotional or Psychological disorders:  NO              Depression or Anxiety:  no  PAST MEDICAL HISTORY:  No past medical history on file.    No Known Allergies     PHYSICAL EXAM:    Temp 97.3 ??F (36.3 ??C)    Ht 5\' 11"  (1.803 m)    Wt 185 lb (83.9 kg)    BMI 25.80 kg/m??   This is an age appropriate appearing male who is alert and oriented x 3.  The patient appears well nourished.  Psychiatric: The patient is able to verbalize normally and seems to have a good understanding of his situation.    Right lower extremity examination    Lymphatic System:  No areas of swelling are seen     Vascular:  Dorsalis pedis pulse: 2+  Posterior tibial pulse:  2+  Capillary refill is less than 3 seconds     Skin/nails:  Normal appearance, warm and dry  Hair growth present on foot and toes    Neurologic:  Sensation intact to light touch throughout the foot and the ankle    Muscle:  Muscle strength testing:    Anterior tibialis: 5/5    Posterior tibialis: 5/5    Peroneus brevis:  5/5    Peroneus longus:  5/5    Gastrocsoleus:  5/5     The Achilles tendon is intact to palpation throughout its length.  Thickening of the Achilles is noted with tenderness.  Compression of the tarsal tunnel did not increase numbness.    Gait and Station:  Birch is able to ambulate with a normal gait  Haydin is able to stand unassisted and maintains  balance        RADIOGRAPHIC INTERPRETATION:    I reviewed the MRI myself and went over the imaging with Craig Patrick.  The Achilles tendon is thickened but no signs of tearing is seen.     REVIEW OF RELATED PREVIOUS DOCUMENTATION:    No documents related to the current problem(s) were reviewed or no documents were available for review.    LABORATORY RESULT INTERPRETATION:    No labs were reviewed/No labs available for review    DIAGNOSIS:      ICD-10-CM    1. Rupture of right Achilles tendon, subsequent encounter  S86.011D Brace        MEDICAL DECISION MAKING:    I had a discussion with Craig Patrick to make sure  he has a good understanding of the diagnoses/issues that I think are present today and understands the plan moving forward.    I explained to Craig Patrick that he does not have a tear of the Achilles but has some tendinosis.  This may explain some of his pain.  I explained he is still at low risk for rerupture and if he wants to try some slow jogging and increasing his activity level I think that is reasonable.  We talked about trying an ankle toe off brace to relieve some of the discomfort that he has during the day with walking.    Tyrae was given a prescription for an Ankle-Toe-Off-Brace today.  I explained that once he gets the brace he needs to increase daily wear one hour per day until he is using it full time.  If the brace is uncomfortable or makes his problem worse, or if he develops another problem as a result of the brace then he needs to have it adjusted by the orthotist.  If Craig Patrick has any other questions pertaining to brace he was told to call me.    Return if symptoms worsen or fail to improve.      Electronically signed by Craig Fowler, MD  [facility]  Department of Orthopedic surgery  04/18/2020  2:02 PM    Voice recognition was used for portions of this note and although it was reviewed prior to signing some incorrect words or phrases could be present.

## 2021-08-30 IMAGING — MR MRI KNEE LT W/O CONTRAST
5 of 6 series · 37 of 40 positions shown · non-contrast
Comparison: none

﻿Pertinent Hx:    Knee pain.
TECHNIQUE: Proton density and T2-weighted sagittal images of the knee were taken.  Proton density coronal and axial images were also obtained.

[Series 3: PD fat-sat · axial · 3.0mm · 0.59mm/px · z∈[-91,+56]mm · 10 of 38 slices shown (1 of 3)]
[im 1/38]
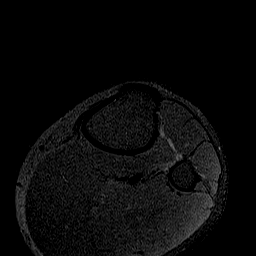
[im 5/38]
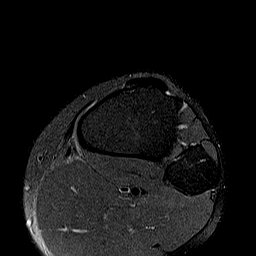
[im 9/38]
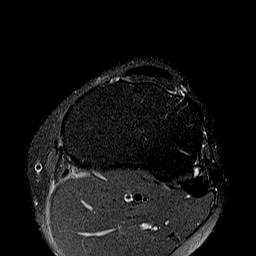
[im 13/38]
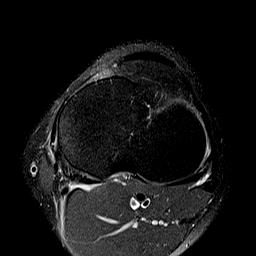
[im 17/38]
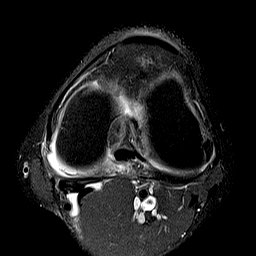
[im 21/38]
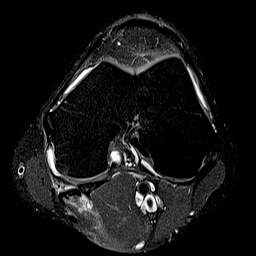
[im 25/38]
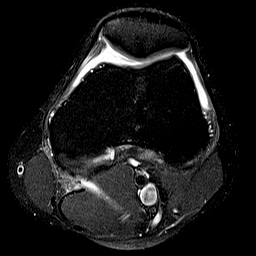
[im 29/38]
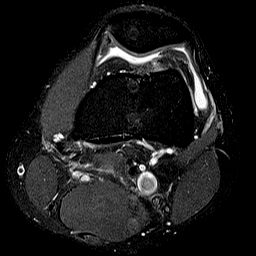
[im 33/38]
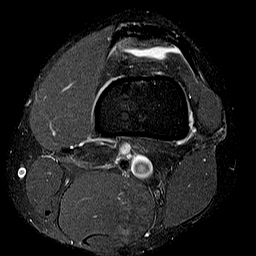
[im 38/38]
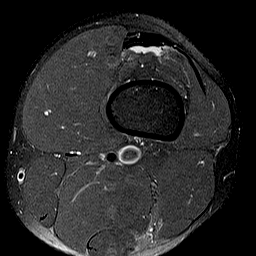

[Series 4: PD fat-sat · sagittal · 4.5mm · 0.47mm/px · 7 of 26 slices shown (2 of 3)]
[im 1/26]
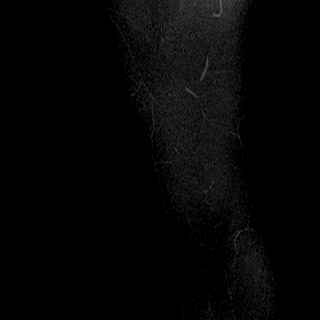
[im 5/26]
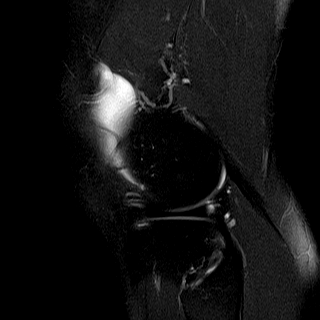
[im 9/26]
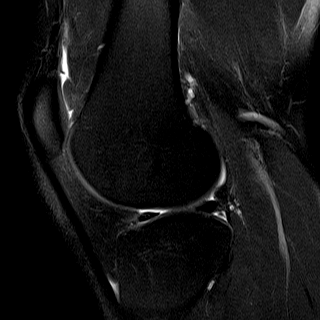
[im 13/26]
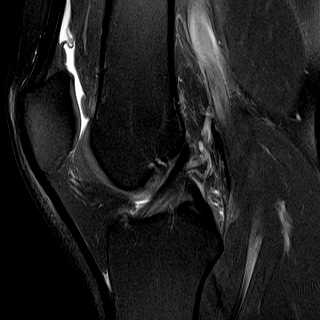
[im 17/26]
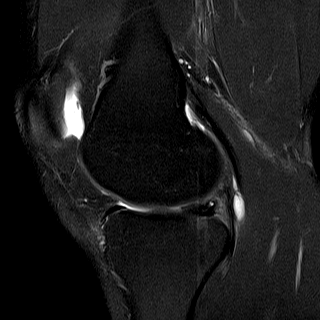
[im 21/26]
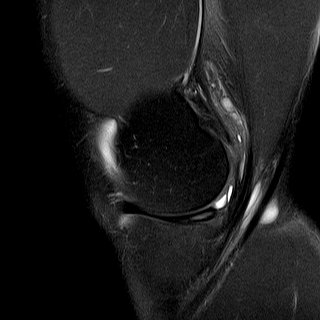
[im 26/26]
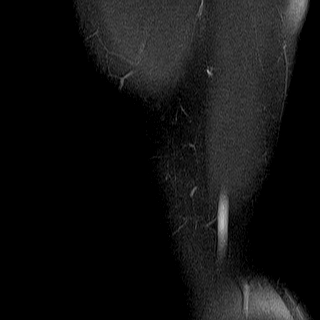

[Series 5: T2 · sagittal · 4.5mm · 0.59mm/px · 7 of 26 slices shown]
[im 1/26]
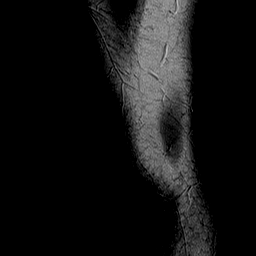
[im 5/26]
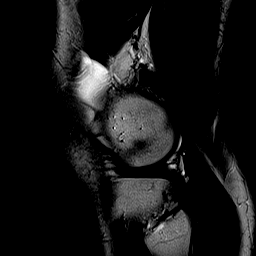
[im 9/26]
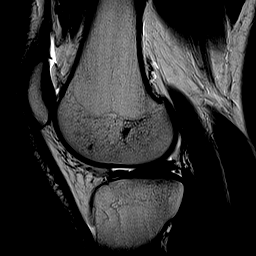
[im 13/26]
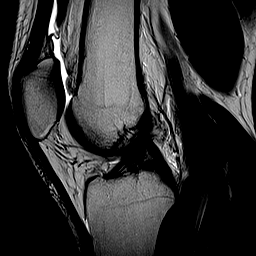
[im 17/26]
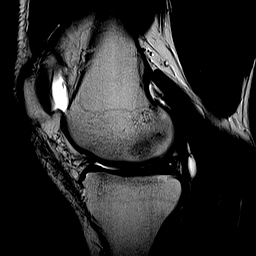
[im 21/26]
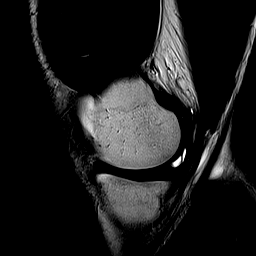
[im 26/26]
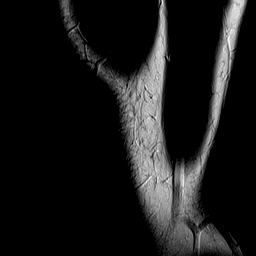

[Series 6: PD · sagittal · 4.5mm · 0.59mm/px · 7 of 26 slices shown]
[im 1/26]
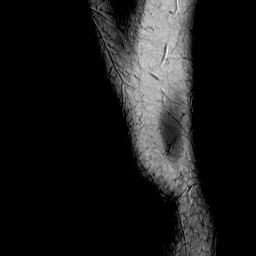
[im 5/26]
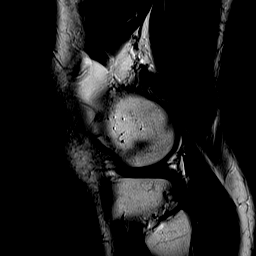
[im 9/26]
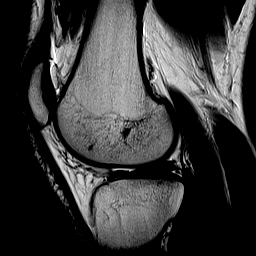
[im 13/26]
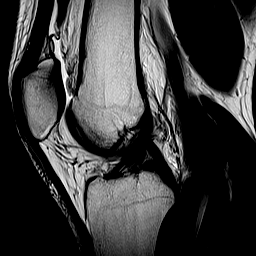
[im 17/26]
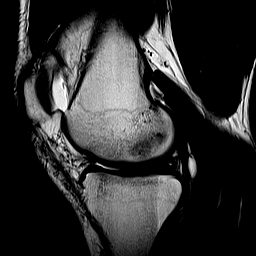
[im 21/26]
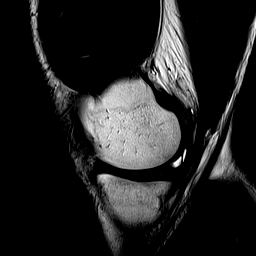
[im 26/26]
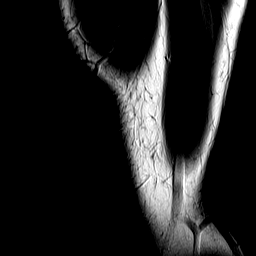

[Series 7: PD fat-sat · coronal · 4.5mm · 0.50mm/px · 6 of 24 slices shown (3 of 3)]
[im 1/24]
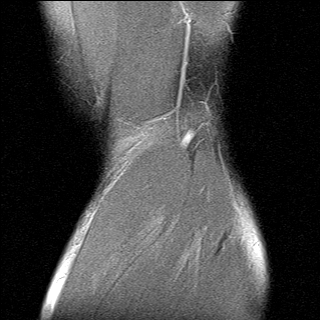
[im 5/24]
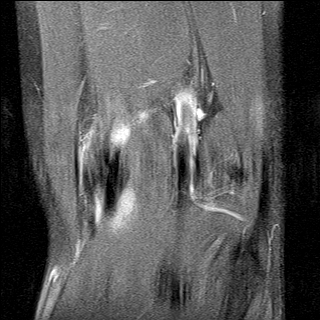
[im 10/24]
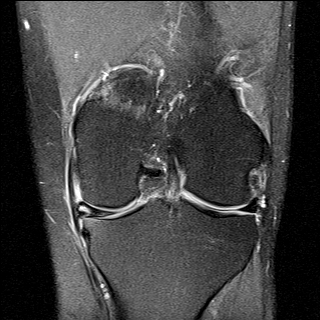
[im 14/24]
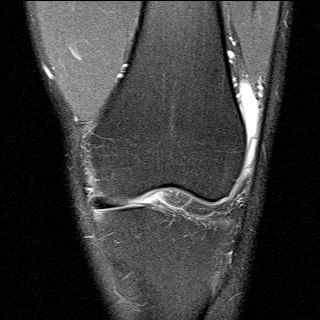
[im 19/24]
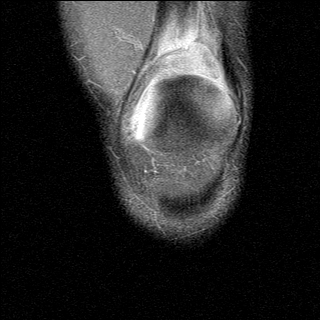
[im 24/24]
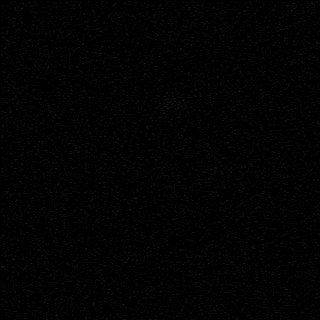

[37 of 40 positions shown; findings below may reference images not displayed]

FINDINGS: There is a nondisplaced tear in the posterior horn and midportion of the medial meniscus.  The tear is horizontally and obliquely oriented without displacement.  The anterior horn of the medial meniscus is normal.  

There is a small knee joint effusion.  There is a small popliteal cyst.  

The lateral meniscus is normal and not torn.  

The anterior and posterior cruciate ligaments are normal.  

Collateral ligaments are normal.  

No chondral abnormality identified.  

The distal quadriceps tendon and the patellar tendon are normal.  

No muscle or tendon abnormality in the popliteal space identified.
IMPRESSION: 1. Tear in the posterior horn and midportion of the medial meniscus.  

2. Small joint effusion and popliteal cyst.  

3. No other abnormality identified.
